# Patient Record
Sex: Female | Born: 2000 | Race: White | Hispanic: Yes | Marital: Single | Smoking: Never smoker
Health system: Southern US, Community
[De-identification: ages and names within clinical notes are randomized; demographics above are authoritative.]

## PROBLEM LIST (undated history)

## (undated) DIAGNOSIS — F419 Anxiety disorder, unspecified: Secondary | ICD-10-CM

## (undated) HISTORY — PX: INGUINAL HERNIA REPAIR: SUR1180

---
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
## 2021-10-27 ENCOUNTER — Ambulatory Visit: Payer: Medicaid Other | Admitting: Family Medicine

## 2022-04-20 ENCOUNTER — Ambulatory Visit: Payer: Medicaid Other | Admitting: Family Medicine

## 2022-05-04 ENCOUNTER — Ambulatory Visit (INDEPENDENT_AMBULATORY_CARE_PROVIDER_SITE_OTHER): Payer: Medicaid Other | Admitting: Family Medicine

## 2022-05-04 ENCOUNTER — Encounter: Payer: Self-pay | Admitting: Family Medicine

## 2022-05-04 VITALS — BP 119/71 | HR 64 | Ht 63.0 in | Wt 182.8 lb

## 2022-05-04 DIAGNOSIS — Z30013 Encounter for initial prescription of injectable contraceptive: Secondary | ICD-10-CM | POA: Diagnosis not present

## 2022-05-04 DIAGNOSIS — Z3046 Encounter for surveillance of implantable subdermal contraceptive: Secondary | ICD-10-CM | POA: Diagnosis not present

## 2022-05-04 NOTE — Assessment & Plan Note (Signed)
-  nexplanon removal and reinsertion performed today, patient tolerated well without complications -after procedure care instructions discussed  -recommended to refrain from sexual activity for at least 1 week for optimal contraception management -advised that she will need replacement in 3 years and nexplanon card provided -strict return precautions discussed  -follow up with PCP as appropriate

## 2022-05-04 NOTE — Progress Notes (Signed)
    SUBJECTIVE:   CHIEF COMPLAINT / HPI:   Patient presents accompanied by her boyfriend for removal and replacement of nexplanon. She denies any other concerns at this time. She is content with the nexplanon and therefore would like to continue this for birth control.   OBJECTIVE:   BP 119/71   Pulse 64   Ht 5\' 3"  (1.6 m)   Wt 182 lb 12.8 oz (82.9 kg)   LMP 04/04/2022   SpO2 100%   BMI 32.38 kg/m   General: Patient well-appearing, in no acute distress.  Resp: normal work of breathing noted Ext: nexplanon in place along left upper extremity and palpated about 6-8 cm away from left medial epicondyle   PROCEDURE NOTE: [place] and REINSERTION  REMOVAL Patient given informed consent and signed copy in the chart for both procedures. an appropriate time out was been taken. Left arm area prepped and draped in the usual sterile fashion. Three cc of 1% lidocaine without epinephrine used for local anesthesia. A small stab incision was made close to the nexplanon with scalpel. Hemostats were used to withdraw the nexplanon which required assistance of attending, Dr. McDiarmid, due to deep positioning of prior nexplanon.  INSERTION new NEXPLANON  Prior to the removal procedure, an appropriate time out had been taken and  there were no breaks in patient care between the procedures. Sterile field had been maintained as well as local anesthesia.   Nexplanon was inserted in typical fashion in the left arm and where removal was performed. There were no complications.  Pressure bandage was  applied to decrease bruising. Patient given follow up instructions should she experience redness, swelling at sight or fever in the next 24 hours. Patient given Nexplanon pocket card.   ASSESSMENT/PLAN:   Contraceptive management -nexplanon removal and reinsertion performed today, patient tolerated well without complications -after procedure care instructions discussed  -recommended to refrain from  sexual activity for at least 1 week for optimal contraception management -advised that she will need replacement in 3 years and nexplanon card provided -strict return precautions discussed  -follow up with PCP as appropriate     -PHQ-9 score of 0 reviewed.   Donney Dice, [place]

## 2022-05-04 NOTE — Patient Instructions (Signed)
It was great seeing you today!  Today we removed your nexplanon since it has expired and placed another one, this will be good for the next 3 years. Please keep the dressing on it for 24 hours and then remove. If you notice any fever, chills, bleeding, drainage or pain then please return for an evaluation. I recommend waiting to be sexually active again for 7 days.   Please follow up at your next scheduled appointment, if anything arises between now and then, please don't hesitate to contact our office.   Thank you for allowing [place] to be a part of your medical care!  Thank you, Dr. Larae Grooms  Also a reminder of our clinic's no-show policy. Please make sure to arrive at least 15 minutes prior to your scheduled appointment time. Please try to cancel before 24 hours if you are not able to make it. If you no-show for 2 appointments then you will be receiving a warning letter. If you no-show after 3 visits, then you may be at risk of being dismissed from our clinic. This is to ensure that everyone is able to be seen in a timely manner. Thank you, we appreciate your assistance with this!
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
## 2022-05-05 MED ORDER — ETONOGESTREL 68 MG ~~LOC~~ IMPL
68.0000 mg | DRUG_IMPLANT | Freq: Once | SUBCUTANEOUS | Status: AC
  Administered 2022-05-04: 68 mg via SUBCUTANEOUS

## 2022-05-05 NOTE — Addendum Note (Signed)
Addended by: Talbot Grumbling on: 05/05/2022 12:44 PM   Modules accepted: Orders

## 2023-06-08 ENCOUNTER — Encounter: Admitting: Family Medicine

## 2023-06-08 NOTE — Progress Notes (Signed)
 [facility] Telemedicine Visit  Attempted to call patient x 3 between 2:45 - 3:00 PM, could not reach patient.  I hope patient will reschedule in the future, it would be my pleasure to care for her.  No charge.  Omar Bibber, DO [facility], PGY-1 06/08/23 3:08 PM

## 2023-08-02 ENCOUNTER — Ambulatory Visit (INDEPENDENT_AMBULATORY_CARE_PROVIDER_SITE_OTHER): Admitting: Student

## 2023-08-02 ENCOUNTER — Encounter: Payer: Self-pay | Admitting: Student

## 2023-08-02 ENCOUNTER — Other Ambulatory Visit: Payer: Self-pay | Admitting: Student

## 2023-08-02 VITALS — BP 118/68 | HR 85 | Ht 63.0 in | Wt 200.0 lb

## 2023-08-02 DIAGNOSIS — Z3046 Encounter for surveillance of implantable subdermal contraceptive: Secondary | ICD-10-CM

## 2023-08-02 MED ORDER — SLYND 4 MG PO TABS: 4.0000 mg | ORAL_TABLET | Freq: Every day | ORAL | 3 refills | Status: DC

## 2023-08-02 NOTE — Progress Notes (Signed)
    SUBJECTIVE:   CHIEF COMPLAINT / HPI: Nexplanon  removal  GYNECOLOGY PROCEDURE NOTE  Jasmine Simpson is a 23 y.o. No obstetric history on file. here for Nexplanon  removal. No other gynecologic concerns.  BP 118/68   Pulse 85   Ht 5' 3 (1.6 m)   Wt 200 lb (90.7 kg)   SpO2 100%   BMI 35.43 kg/m  Nexplanon  Removal Patient identified, informed consent performed, consent signed.   Appropriate time out taken. Nexplanon  site identified.  Area prepped in usual sterile fashon. 5mL of 1% lidocaine  was used to anesthetize along the length of the implant. A small stab incision was made right beside the implant on the distal portion, the implant was mobile in the arm and difficult to grasp, so a second incision was made along the middle portion of the implant.  The Nexplanon  rod was grasped using hemostats and removed without difficulty.  There was minimal blood loss. There were no complications.  Steri-strips were applied over the small incision.  A pressure bandage was applied to reduce any bruising.  The patient tolerated the procedure well and was given post procedure instructions.  Patient is planning to use POPs for contraception/attempt conception.  She is concerned about estrogen-containing birth control as her sister was recently diagnosed with a VTE.   Jasmine Con Gasman, MD 08/04/2023, 8:57 AM PGY-3, [facility]

## 2023-08-02 NOTE — Patient Instructions (Signed)
 Ike Cheng to meet you!  We removed your nexplanon  today. I am sending in [place] birth control pills. If these are too expensive and/or not covered by your insurance, please call me and let me know and we can look at other options. I would recommend going ahead and starting a prenatal vitamin every day.    JINNY Con Gasman, MD

## 2023-08-04 ENCOUNTER — Other Ambulatory Visit: Payer: Self-pay | Admitting: Student

## 2023-08-04 MED ORDER — NORETHINDRONE 0.35 MG PO TABS: 1.0000 | ORAL_TABLET | Freq: Every day | ORAL | 11 refills | Status: AC
